# Patient Record
Sex: Male | Born: 1980 | Race: White | Hispanic: No | Marital: Married | Smoking: Never smoker
Health system: Southern US, Community
[De-identification: ages and names within clinical notes are randomized; demographics above are authoritative.]

## PROBLEM LIST (undated history)

## (undated) HISTORY — PX: MANDIBLE FRACTURE SURGERY: SHX706

## (undated) HISTORY — PX: ANKLE SURGERY: SHX546

## (undated) HISTORY — PX: FACIAL FRACTURE SURGERY: SHX1570

---

## 2007-12-10 ENCOUNTER — Emergency Department: Payer: Self-pay | Admitting: Emergency Medicine

## 2017-09-28 ENCOUNTER — Emergency Department: Payer: Managed Care, Other (non HMO)

## 2017-09-28 ENCOUNTER — Encounter: Payer: Self-pay | Admitting: Emergency Medicine

## 2017-09-28 ENCOUNTER — Emergency Department
Admission: EM | Admit: 2017-09-28 | Discharge: 2017-09-28 | Disposition: A | Discharge status: Home / Self Care | Payer: Managed Care, Other (non HMO) | Attending: Student in an Organized Health Care Education/Training Program | Admitting: Student in an Organized Health Care Education/Training Program

## 2017-09-28 DIAGNOSIS — M542 Cervicalgia: Secondary | ICD-10-CM | POA: Insufficient documentation

## 2017-09-28 DIAGNOSIS — R55 Syncope and collapse: Secondary | ICD-10-CM | POA: Diagnosis present

## 2017-09-28 LAB — URINALYSIS, COMPLETE (UACMP) WITH MICROSCOPIC
Bacteria, UA: NONE SEEN
Bilirubin Urine: NEGATIVE
GLUCOSE, UA: NEGATIVE mg/dL
HGB URINE DIPSTICK: NEGATIVE
KETONES UR: NEGATIVE mg/dL
Leukocytes, UA: NEGATIVE
NITRITE: NEGATIVE
PH: 6 (ref 5.0–8.0)
Protein, ur: NEGATIVE mg/dL
Specific Gravity, Urine: 1.005 (ref 1.005–1.030)
Squamous Epithelial / LPF: NONE SEEN

## 2017-09-28 LAB — BASIC METABOLIC PANEL
ANION GAP: 10 (ref 5–15)
BUN: 18 mg/dL (ref 6–20)
CALCIUM: 9.3 mg/dL (ref 8.9–10.3)
CO2: 24 mmol/L (ref 22–32)
CREATININE: 1.06 mg/dL (ref 0.61–1.24)
Chloride: 104 mmol/L (ref 101–111)
GFR calc Af Amer: >60 mL/min (ref >60)
GLUCOSE: 101 mg/dL — AB (ref 65–99)
Potassium: 3.9 mmol/L (ref 3.5–5.1)
Sodium: 138 mmol/L (ref 135–145)

## 2017-09-28 LAB — CBC
HCT: 37.7 % — ABNORMAL LOW (ref 40.0–52.0)
Hemoglobin: 12.1 g/dL — ABNORMAL LOW (ref 13.0–18.0)
MCH: 20.3 pg — AB (ref 26.0–34.0)
MCHC: 32 g/dL (ref 32.0–36.0)
MCV: 63.5 fL — ABNORMAL LOW (ref 80.0–100.0)
PLATELETS: 212 10*3/uL (ref 150–440)
RBC: 5.93 MIL/uL — ABNORMAL HIGH (ref 4.40–5.90)
RDW: 15.8 % — AB (ref 11.5–14.5)
WBC: 6 10*3/uL (ref 3.8–10.6)

## 2017-09-28 LAB — TROPONIN I: Troponin I: <0.03 ng/mL (ref <0.03)

## 2017-09-28 MED ORDER — CYCLOBENZAPRINE HCL 10 MG PO TABS: 10.0000 mg | ORAL_TABLET | Freq: Three times a day (TID) | ORAL | 0 refills | Status: DC | PRN

## 2017-09-28 MED ORDER — SODIUM CHLORIDE 0.9 % IV BOLUS (SEPSIS)
1000.0000 mL | Freq: Once | INTRAVENOUS | Status: AC
  Administered 2017-09-28: 1000 mL via INTRAVENOUS

## 2017-09-28 MED ORDER — IOPAMIDOL (ISOVUE-370) INJECTION 76%
75.0000 mL | Freq: Once | INTRAVENOUS | Status: AC | PRN
  Administered 2017-09-28: 75 mL via INTRAVENOUS
  Filled 2017-09-28: qty 75

## 2017-09-28 NOTE — ED Notes (Signed)
Pt ambulatory top bathroom by self. IVF almost finished.

## 2017-09-28 NOTE — ED Notes (Signed)
Lab notified to add on troponin to previous sample.

## 2017-09-28 NOTE — ED Notes (Signed)
Patient transported to CT 
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
## 2017-09-28 NOTE — ED Notes (Signed)
Pt returned from CT via wheelchair.

## 2018-10-11 ENCOUNTER — Ambulatory Visit
Admission: EM | Admit: 2018-10-11 | Discharge: 2018-10-11 | Disposition: A | Discharge status: Home / Self Care | Payer: Managed Care, Other (non HMO) | Attending: Family Medicine | Admitting: Family Medicine

## 2018-10-11 ENCOUNTER — Encounter: Payer: Self-pay | Admitting: Emergency Medicine

## 2018-10-11 ENCOUNTER — Other Ambulatory Visit: Payer: Self-pay

## 2018-10-11 DIAGNOSIS — R05 Cough: Secondary | ICD-10-CM | POA: Insufficient documentation

## 2018-10-11 DIAGNOSIS — R0602 Shortness of breath: Secondary | ICD-10-CM | POA: Diagnosis not present

## 2018-10-11 DIAGNOSIS — R059 Cough, unspecified: Secondary | ICD-10-CM

## 2018-10-11 LAB — RAPID INFLUENZA A&B ANTIGENS: Influenza A (ARMC): NEGATIVE

## 2018-10-11 LAB — RAPID INFLUENZA A&B ANTIGENS (ARMC ONLY): INFLUENZA B (ARMC): NEGATIVE

## 2018-10-11 MED ORDER — ALBUTEROL SULFATE HFA 108 (90 BASE) MCG/ACT IN AERS: 1.0000 | INHALATION_SPRAY | Freq: Four times a day (QID) | RESPIRATORY_TRACT | 0 refills | Status: DC | PRN

## 2018-10-11 MED ORDER — BENZONATATE 200 MG PO CAPS: ORAL_CAPSULE | ORAL | 0 refills | Status: DC

## 2018-10-11 NOTE — ED Provider Notes (Signed)
[facility]    CSN: [id number] Arrival date & time: 10/11/18  1555     History   Chief Complaint Chief Complaint  Patient presents with  . Cough    HPI Franklin Reilly is a 37 y.o. male.   HPI  -year-old male presents with cough and shortness of breath.He felt fine yesterday went to bed as usual without any problem.  At 1:00 this morning he awoke with severe coughing and shortness of breath as well as chest pressure.  He states that the pressure persisted as the day progressed seem to be getting better.  He works Landscape architectdelivering uniforms and states that he did not notice an increase in the discomfort while being active.  Cough is also improved somewhat.  Is concerned with his wife be an [redacted] weeks pregnant and did not want to have her infected with any kind of illness.  Has had no fever or chills.  He has had no sinus or nasal discharge.  He has had some thick sputum but this is not a prominent feature.  He is afebrile.  O2 sats are 98% on room air.  He has no past history of coronary problems.  He has no history of hypertension hyper cholesterolemia.  He does not smoke.  No family history of coronary disease.  He denies nausea or vomiting.  Denies having a radiation of the chest pressure.  States that the pressure is exacerbated with deep inspiration       History reviewed. No pertinent past medical history.  There are no active problems to display for this patient.   Past Surgical History:  Procedure Laterality Date  . ANKLE SURGERY Right   . FACIAL FRACTURE SURGERY     metal plates in cheeks  . MANDIBLE FRACTURE SURGERY Left        Home Medications    Prior to Admission medications   Medication Sig Start Date End Date Taking? Authorizing Provider  albuterol (PROVENTIL HFA;VENTOLIN HFA) 108 (90 Base) MCG/ACT inhaler Inhale 1-2 puffs into the lungs every 6 (six) hours as needed for wheezing or shortness of breath. Use with spacer 10/11/18   Lutricia Feiloemer, Latarsha Zani P,  PA-C  benzonatate (TESSALON) 200 MG capsule Take one cap TID PRN cough 10/11/18   Lutricia Feiloemer, [place] P, PA-C    Family History Family History  Problem Relation Age of Onset  . Healthy Mother   . Healthy Father     Social History Social History   Tobacco Use  . Smoking status: Never Smoker  . Smokeless tobacco: Never Used  Substance Use Topics  . Alcohol use: Yes    Comment: socially  . Drug use: Never     Allergies   Patient has no known allergies.   Review of Systems Review of Systems  Constitutional: Positive for activity change. Negative for appetite change, chills, diaphoresis, fatigue and fever.  HENT: Positive for congestion.   Respiratory: Positive for cough and shortness of breath. Negative for wheezing and stridor.   All other systems reviewed and are negative.    Physical Exam Triage Vital Signs ED Triage Vitals  Enc Vitals Group     BP 10/11/18 1607 115/89     Pulse Rate 10/11/18 1607 89     Resp 10/11/18 1607 16     Temp 10/11/18 1607 98.2 F (36.8 C)     Temp Source 10/11/18 1607 Oral     SpO2 10/11/18 1607 98 %     Weight 10/11/18 1604 220  lb (99.8 kg)     Height 10/11/18 1604 5\' 11"  (1.803 m)     Head Circumference --      Peak Flow --      Pain Score 10/11/18 1604 4     Pain Loc --      Pain Edu? --      Excl. in GC? --    No data found.  Updated Vital Signs BP 115/89 (BP Location: Left Arm)   Pulse 89   Temp 98.2 F (36.8 C) (Oral)   Resp 16   Ht 5\' 11"  (1.803 m)   Wt 220 lb (99.8 kg)   SpO2 98%   BMI 30.68 kg/m   Visual Acuity Right Eye Distance:   Left Eye Distance:   Bilateral Distance:    Right Eye Near:   Left Eye Near:    Bilateral Near:     Physical Exam Vitals signs and nursing note reviewed.  Constitutional:      General: He is not in acute distress.    Appearance: Normal appearance. He is normal weight. He is not ill-appearing, toxic-appearing or diaphoretic.  HENT:     Head: Normocephalic.     Right Ear:  Tympanic membrane, ear canal and external ear normal.     Left Ear: Tympanic membrane, ear canal and external ear normal.     Nose: Nose normal.     Mouth/Throat:     Mouth: Mucous membranes are moist.     Pharynx: No oropharyngeal exudate or posterior oropharyngeal erythema.  Eyes:     General:        Right eye: No discharge.        Left eye: No discharge.     Conjunctiva/sclera: Conjunctivae normal.     Pupils: Pupils are equal, round, and reactive to light.  Neck:     Musculoskeletal: Normal range of motion.  Pulmonary:     Effort: Pulmonary effort is normal.     Breath sounds: Normal breath sounds.  Musculoskeletal: Normal range of motion.  Lymphadenopathy:     Cervical: No cervical adenopathy.  Skin:    General: Skin is warm and dry.  Neurological:     General: No focal deficit present.     Mental Status: He is alert and oriented to person, place, and time.  Psychiatric:        Mood and Affect: Mood normal.        Behavior: Behavior normal.        Thought Content: Thought content normal.        Judgment: Judgment normal.      UC Treatments / Results  Labs (all labs ordered are listed, but only abnormal results are displayed) Labs Reviewed  RAPID INFLUENZA A&B ANTIGENS ([facility] ONLY)    EKG ED ECG REPORT   Date: 10/11/2018  EKG Time: 4:53 PM  Rate: 83  Rhythm: normal sinus rhythm,  unchanged from previous tracings"}  Axis: normal  Intervals:none  ST&T Change: none  Narrative Interpretation: Sinus rhythm without acute changes          Radiology No results found.  Procedures Procedures (including critical care time)  Medications Ordered in UC Medications - No data to display  Initial Impression / Assessment and Plan / UC Course  I have reviewed the triage vital signs and the nursing notes.  Pertinent labs & imaging results that were available during my care of the patient were reviewed by me and considered in my medical decision making (see  chart for  details).   I reviewed the patient's EKG with him.  It is normal sinus rhythm with no acute injury.  I have told him I do not have a good explanation of his awakening with shortness of breath and the chest pressure other than he may be developing an upper respiratory infection.  He does have it again especially if it persists for more than 5 to 10 minutes he should go to the emergency room.  Otherwise I will prescribe albuterol inhaler for his shortness of breath and provide him with Tessalon Perles for cough.  He is not improving or is having worsening of his symptoms he may return to our clinic or follow-up with your primary care physician   Final Clinical Impressions(s) / UC Diagnoses   Final diagnoses:  Cough  SOB (shortness of breath)   Discharge Instructions   None    ED Prescriptions    Medication Sig Dispense Auth. Provider   albuterol (PROVENTIL HFA;VENTOLIN HFA) 108 (90 Base) MCG/ACT inhaler Inhale 1-2 puffs into the lungs every 6 (six) hours as needed for wheezing or shortness of breath. Use with spacer 1 Inhaler Lutricia Feil, PA-C   benzonatate (TESSALON) 200 MG capsule Take one cap TID PRN cough 30 capsule Lutricia Feil, PA-C     Controlled Substance Prescriptions [place] Controlled Substance Registry consulted? Not Applicable   Lutricia Feil, PA-C 10/11/18 1654

## 2018-10-11 NOTE — ED Triage Notes (Signed)
Patient c/o cough and congestion, HAs and bodyaches that started early this morning.
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
## 2020-06-26 ENCOUNTER — Other Ambulatory Visit: Payer: Self-pay | Admitting: Internal Medicine

## 2020-06-26 DIAGNOSIS — R1032 Left lower quadrant pain: Secondary | ICD-10-CM

## 2020-07-03 ENCOUNTER — Ambulatory Visit: Payer: Managed Care, Other (non HMO)

## 2020-07-04 ENCOUNTER — Other Ambulatory Visit: Payer: Self-pay

## 2020-07-04 ENCOUNTER — Ambulatory Visit
Admission: RE | Admit: 2020-07-04 | Discharge: 2020-07-04 | Disposition: A | Discharge status: Home / Self Care | Payer: Managed Care, Other (non HMO) | Source: Ambulatory Visit | Attending: Internal Medicine | Admitting: Internal Medicine

## 2020-07-04 DIAGNOSIS — R1032 Left lower quadrant pain: Secondary | ICD-10-CM

## 2020-07-04 MED ORDER — IOHEXOL 300 MG/ML  SOLN
100.0000 mL | Freq: Once | INTRAMUSCULAR | Status: AC | PRN
  Administered 2020-07-04: 100 mL via INTRAVENOUS

## 2022-06-02 ENCOUNTER — Encounter: Payer: Self-pay | Admitting: Internal Medicine

## 2022-06-02 ENCOUNTER — Inpatient Hospital Stay: Payer: Managed Care, Other (non HMO)

## 2022-06-02 ENCOUNTER — Inpatient Hospital Stay: Payer: Managed Care, Other (non HMO) | Attending: Internal Medicine | Admitting: Internal Medicine

## 2022-06-02 DIAGNOSIS — Z803 Family history of malignant neoplasm of breast: Secondary | ICD-10-CM | POA: Insufficient documentation

## 2022-06-02 DIAGNOSIS — R5383 Other fatigue: Secondary | ICD-10-CM | POA: Insufficient documentation

## 2022-06-02 DIAGNOSIS — D509 Iron deficiency anemia, unspecified: Secondary | ICD-10-CM | POA: Diagnosis not present

## 2022-06-02 LAB — CBC WITH DIFFERENTIAL/PLATELET
Abs Immature Granulocytes: 0.01 10*3/uL (ref 0.00–0.07)
Basophils Absolute: 0 10*3/uL (ref 0.0–0.1)
Basophils Relative: 1 %
Eosinophils Absolute: 0.1 10*3/uL (ref 0.0–0.5)
Eosinophils Relative: 1 %
HCT: 41.4 % (ref 39.0–52.0)
Hemoglobin: 12.8 g/dL — ABNORMAL LOW (ref 13.0–17.0)
Immature Granulocytes: 0 %
Lymphocytes Relative: 37 %
Lymphs Abs: 1.8 10*3/uL (ref 0.7–4.0)
MCH: 20.2 pg — ABNORMAL LOW (ref 26.0–34.0)
MCHC: 30.9 g/dL (ref 30.0–36.0)
MCV: 65.2 fL — ABNORMAL LOW (ref 80.0–100.0)
Monocytes Absolute: 0.4 10*3/uL (ref 0.1–1.0)
Monocytes Relative: 7 %
Neutro Abs: 2.6 10*3/uL (ref 1.7–7.7)
Neutrophils Relative %: 54 %
Platelets: 222 10*3/uL (ref 150–400)
RBC: 6.35 MIL/uL — ABNORMAL HIGH (ref 4.22–5.81)
RDW: 18.1 % — ABNORMAL HIGH (ref 11.5–15.5)
WBC: 4.9 10*3/uL (ref 4.0–10.5)
nRBC: 0 % (ref 0.0–0.2)

## 2022-06-02 LAB — RETICULOCYTES
Immature Retic Fract: 18.5 % — ABNORMAL HIGH (ref 2.3–15.9)
RBC.: 6.27 MIL/uL — ABNORMAL HIGH (ref 4.22–5.81)
Retic Count, Absolute: 98.4 10*3/uL (ref 19.0–186.0)
Retic Ct Pct: 1.6 % (ref 0.4–3.1)

## 2022-06-02 LAB — COMPREHENSIVE METABOLIC PANEL
ALT: 44 U/L (ref 0–44)
AST: 27 U/L (ref 15–41)
Albumin: 4.4 g/dL (ref 3.5–5.0)
Alkaline Phosphatase: 50 U/L (ref 38–126)
Anion gap: 7 (ref 5–15)
BUN: 12 mg/dL (ref 6–20)
CO2: 27 mmol/L (ref 22–32)
Calcium: 9.2 mg/dL (ref 8.9–10.3)
Chloride: 104 mmol/L (ref 98–111)
Creatinine, Ser: 0.93 mg/dL (ref 0.61–1.24)
GFR, Estimated: >60 mL/min (ref >60)
Glucose, Bld: 96 mg/dL (ref 70–99)
Potassium: 4.1 mmol/L (ref 3.5–5.1)
Sodium: 138 mmol/L (ref 135–145)
Total Bilirubin: 0.9 mg/dL (ref 0.3–1.2)
Total Protein: 7.8 g/dL (ref 6.5–8.1)

## 2022-06-02 LAB — IRON AND TIBC
Iron: 126 ug/dL (ref 45–182)
Saturation Ratios: 35 % (ref 17.9–39.5)
TIBC: 358 ug/dL (ref 250–450)
UIBC: 232 ug/dL

## 2022-06-02 LAB — LACTATE DEHYDROGENASE: LDH: 110 U/L (ref 98–192)

## 2022-06-02 LAB — FERRITIN: Ferritin: 171 ng/mL (ref 24–336)
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
## 2022-06-02 NOTE — Assessment & Plan Note (Addendum)
#  Chronic microcytic Anemia- Hb- 11-12; MCV 65-symptomatic.  Suspect underlying thalassemia; possible other etiologies.  #Fatigue: Question related to above microcytic anemia versus other causes [endocrinopathies; OSA; copper zinc levels].  Check iron studies LDH CBC CMP.  #Had a long discussion with patient regarding the treatment options-if his symptoms/labs will depend upon etiology.  We will need to review above work-up; and recommendations for follow-up.  Thank you Dr. Judithann Sheen for allowing me to participate in the care of your pleasant patient. Please do not hesitate to contact me with questions or concerns in the interim.  # DISPOSITION: # labs today- ordered # follow up in 2-3 weeks  MD:no labs- Dr.B  Cc; Dr.Sparks
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
## 2022-06-03 LAB — THYROID PANEL WITH TSH
Free Thyroxine Index: 2 (ref 1.2–4.9)
T3 Uptake Ratio: 25 % (ref 24–39)
T4, Total: 7.8 ug/dL (ref 4.5–12.0)
TSH: 1.37 u[IU]/mL (ref 0.450–4.500)

## 2022-06-03 LAB — TESTOSTERONE: Testosterone: 316 ng/dL (ref 264–916)

## 2022-06-04 LAB — HGB FRACTIONATION CASCADE
Hgb A2: 5.5 % — ABNORMAL HIGH (ref 1.8–3.2)
Hgb A: 93.2 % — ABNORMAL LOW (ref 96.4–98.8)
Hgb F: 1.3 % (ref 0.0–2.0)
Hgb S: 0 %

## 2022-06-05 LAB — ZINC: Zinc: 75 ug/dL (ref 44–115)

## 2022-06-05 LAB — COPPER, SERUM: Copper: 97 ug/dL (ref 69–132)

## 2022-06-12 LAB — ALPHA-THALASSEMIA GENOTYPR

## 2022-06-23 ENCOUNTER — Encounter: Payer: Self-pay | Admitting: Internal Medicine

## 2022-06-23 ENCOUNTER — Inpatient Hospital Stay (HOSPITAL_BASED_OUTPATIENT_CLINIC_OR_DEPARTMENT_OTHER): Payer: Managed Care, Other (non HMO) | Admitting: Internal Medicine

## 2022-06-23 DIAGNOSIS — D509 Iron deficiency anemia, unspecified: Secondary | ICD-10-CM | POA: Diagnosis not present

## 2022-06-23 NOTE — Progress Notes (Signed)
No concerns. 

## 2022-06-23 NOTE — Assessment & Plan Note (Addendum)
#  Chronic microcytic Anemia- Hb- 11-12; MCV 65-symptomatic.  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  No serious complications are expected.  However patient decides to have future children; recommend pre- conception counseling.    #Fatigue: Unclear etiology testosterone TSH-within normal limits.  Recommend evaluation for OSA/defer to PCP.  # DISPOSITION: # follow up as needed- Dr.B  Cc; Dr.Sparks

## 2022-06-23 NOTE — Progress Notes (Signed)
[facility] CONSULT NOTE  Patient Care Team: Marguarite Arbour, MD as PCP - General (Internal Medicine) Earna Coder, MD as Consulting Physician (Oncology)  CHIEF COMPLAINTS/PURPOSE OF CONSULTATION: ANEMIA   HEMATOLOGY HISTORY  # ANEMIA[Hb;11-12JUNE 2023- MCV-platelets- WBC; Iron sat;- 24% ferritin- WNL  EGD/colonoscopy-none  2018- syncope  HISTORY OF PRESENTING ILLNESS: Alone.  Ambulating independently.  Franklin Reilly 41 y.o.  male pleasant patient is here for follow-up of his microcytic anemia/review labs.  Patient continues to have fatigue.  Otherwise denies any blood in stools black or stools.  Review of Systems  Constitutional:  Positive for malaise/fatigue. Negative for chills, diaphoresis, fever and weight loss.  HENT:  Negative for nosebleeds and sore throat.   Eyes:  Negative for double vision.  Respiratory:  Negative for cough, hemoptysis, sputum production, shortness of breath and wheezing.   Cardiovascular:  Negative for chest pain, palpitations, orthopnea and leg swelling.  Gastrointestinal:  Negative for abdominal pain, blood in stool, constipation, diarrhea, heartburn, melena, nausea and vomiting.  Genitourinary:  Negative for dysuria, frequency and urgency.  Musculoskeletal:  Negative for back pain and joint pain.  Skin: Negative.  Negative for itching and rash.  Neurological:  Negative for dizziness, tingling, focal weakness, weakness and headaches.  Endo/Heme/Allergies:  Does not bruise/bleed easily.  Psychiatric/Behavioral:  Negative for depression. The patient is not nervous/anxious and does not have insomnia.      MEDICAL HISTORY:  History reviewed. No pertinent past medical history.  SURGICAL HISTORY: Past Surgical History:  Procedure Laterality Date   ANKLE SURGERY Right    FACIAL FRACTURE SURGERY     metal plates in cheeks   MANDIBLE FRACTURE SURGERY Left     SOCIAL HISTORY: Social History   Socioeconomic History    Marital status: Married    Spouse name: Not on file   Number of children: Not on file   Years of education: Not on file   Highest education level: Not on file  Occupational History   Not on file  Tobacco Use   Smoking status: Never   Smokeless tobacco: Never  Vaping Use   Vaping Use: Never used  Substance and Sexual Activity   Alcohol use: Yes    Comment: socially   Drug use: Never   Sexual activity: Not on file  Other Topics Concern   Not on file  Social History Narrative   Lives in [place]; wife; 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    Social Determinants of Health   Financial Resource Strain: Not on file  Food Insecurity: Not on file  Transportation Needs: Not on file  Physical Activity: Not on file  Stress: Not on file  Social Connections: Not on file  Intimate Partner Violence: Not on file    FAMILY HISTORY: Family History  Problem Relation Age of Onset   Healthy Mother    Healthy Father    Breast cancer Maternal Grandmother     ALLERGIES:  has No Known Allergies.  MEDICATIONS:  Current Outpatient Medications  Medication Sig Dispense Refill   B Complex-C (B-COMPLEX WITH VITAMIN C) tablet Take 1 tablet by mouth daily.     ferrous sulfate 325 (65 FE) MG tablet Take 325 mg by mouth daily with breakfast. OTC     No current facility-administered medications for this visit.     Marland Kitchen  PHYSICAL EXAMINATION:   Vitals:   06/23/22 1452  BP: 111/82  Pulse: 90  Temp: (!) 97 F (36.1 C)  SpO2: 100%   Filed Weights   06/23/22 1452  Weight: 239 lb 9.6 oz (108.7 kg)    Physical Exam Vitals and nursing note reviewed.  HENT:     Head: Normocephalic and atraumatic.     Mouth/Throat:     Pharynx: Oropharynx is clear.  Eyes:     Extraocular Movements: Extraocular movements intact.     Pupils: Pupils are equal, round, and reactive to light.  Cardiovascular:     Rate and Rhythm: Normal rate and regular rhythm.  Pulmonary:      Comments: Decreased breath sounds bilaterally.  Abdominal:     Palpations: Abdomen is soft.  Musculoskeletal:        General: Normal range of motion.     Cervical back: Normal range of motion.  Skin:    General: Skin is warm.  Neurological:     General: No focal deficit present.     Mental Status: He is alert and oriented to person, place, and time.  Psychiatric:        Behavior: Behavior normal.        Judgment: Judgment normal.      LABORATORY DATA:  I have reviewed the data as listed Lab Results  Component Value Date   WBC 4.9 06/02/2022   HGB 12.8 (L) 06/02/2022   HCT 41.4 06/02/2022   MCV 65.2 (L) 06/02/2022   PLT 222 06/02/2022   Recent Labs    06/02/22 1208  NA 138  K 4.1  CL 104  CO2 27  GLUCOSE 96  BUN 12  CREATININE 0.93  CALCIUM 9.2  GFRNONAA >60  PROT 7.8  ALBUMIN 4.4  AST 27  ALT 44  ALKPHOS 50  BILITOT 0.9     No results found.  ASSESSMENT & PLAN:   Microcytic anemia  #Chronic microcytic Anemia- Hb- 11-12; MCV 65-symptomatic.  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  No serious complications are expected.  However patient decides to have future children; recommend pre- conception counseling.    #Fatigue: Unclear etiology testosterone TSH-within normal limits.  Recommend evaluation for OSA/defer to PCP.  # DISPOSITION: # follow up as needed- Dr.B  Cc; Dr.Sparks     All questions were answered. The patient knows to call the clinic with any problems, questions or concerns.    Earna Coder, MD 06/23/2022 7:42 PM

## 2022-07-16 ENCOUNTER — Other Ambulatory Visit: Payer: Self-pay | Admitting: Internal Medicine

## 2022-07-16 DIAGNOSIS — R1012 Left upper quadrant pain: Secondary | ICD-10-CM

## 2022-08-25 ENCOUNTER — Other Ambulatory Visit: Payer: Managed Care, Other (non HMO)

## 2022-09-08 ENCOUNTER — Other Ambulatory Visit: Payer: Managed Care, Other (non HMO)

## 2022-09-10 ENCOUNTER — Ambulatory Visit
Admission: RE | Admit: 2022-09-10 | Discharge: 2022-09-10 | Disposition: A | Discharge status: Home / Self Care | Payer: Managed Care, Other (non HMO) | Source: Ambulatory Visit | Attending: Internal Medicine | Admitting: Internal Medicine

## 2022-09-10 DIAGNOSIS — R1012 Left upper quadrant pain: Secondary | ICD-10-CM

## 2023-01-01 ENCOUNTER — Ambulatory Visit: Payer: Managed Care, Other (non HMO)

## 2023-01-01 DIAGNOSIS — K2950 Unspecified chronic gastritis without bleeding: Secondary | ICD-10-CM | POA: Diagnosis present
